# Patient Record
Sex: Female | Born: 1975 | Race: Asian | Hispanic: No | Marital: Married | Smoking: Never smoker
Health system: Southern US, Community
[De-identification: ages and names within clinical notes are randomized; demographics above are authoritative.]

## PROBLEM LIST (undated history)

## (undated) DIAGNOSIS — Z789 Other specified health status: Secondary | ICD-10-CM

## (undated) HISTORY — DX: Other specified health status: Z78.9

## (undated) HISTORY — PX: BREAST CYST INCISION AND DRAINAGE: SHX14

## (undated) HISTORY — PX: AUGMENTATION MAMMAPLASTY: SUR837

---

## 2008-06-13 ENCOUNTER — Emergency Department (HOSPITAL_COMMUNITY): Admission: EM | Admit: 2008-06-13 | Discharge: 2008-06-13 | Payer: Self-pay | Admitting: Family Medicine

## 2008-11-09 ENCOUNTER — Emergency Department (HOSPITAL_COMMUNITY): Admission: EM | Admit: 2008-11-09 | Discharge: 2008-11-09 | Payer: Self-pay | Admitting: Emergency Medicine

## 2010-07-05 ENCOUNTER — Emergency Department (HOSPITAL_COMMUNITY): Admission: EM | Admit: 2010-07-05 | Discharge: 2010-07-05 | Payer: Self-pay | Admitting: Family Medicine

## 2010-09-23 ENCOUNTER — Inpatient Hospital Stay (HOSPITAL_COMMUNITY)
Admission: AD | Admit: 2010-09-23 | Discharge: 2010-09-23 | Payer: Self-pay | Source: Home / Self Care | Admitting: Obstetrics & Gynecology

## 2011-01-01 LAB — URINALYSIS, ROUTINE W REFLEX MICROSCOPIC
Bilirubin Urine: NEGATIVE
Ketones, ur: NEGATIVE mg/dL
Nitrite: NEGATIVE
Protein, ur: NEGATIVE mg/dL
Specific Gravity, Urine: >1.03 — ABNORMAL HIGH (ref 1.005–1.030)
Urobilinogen, UA: 0.2 mg/dL (ref 0.0–1.0)

## 2011-01-01 LAB — CBC
MCH: 30.7 pg (ref 26.0–34.0)
MCHC: 34.3 g/dL (ref 30.0–36.0)
MCV: 89.5 fL (ref 78.0–100.0)
Platelets: 358 10*3/uL (ref 150–400)
RBC: 3.72 MIL/uL — ABNORMAL LOW (ref 3.87–5.11)

## 2011-01-01 LAB — WET PREP, GENITAL
Clue Cells Wet Prep HPF POC: NONE SEEN
Trich, Wet Prep: NONE SEEN
Yeast Wet Prep HPF POC: NONE SEEN

## 2011-01-01 LAB — GC/CHLAMYDIA PROBE AMP, GENITAL: GC Probe Amp, Genital: NEGATIVE

## 2011-01-01 LAB — POCT PREGNANCY, URINE: Preg Test, Ur: NEGATIVE

## 2011-01-01 LAB — URINE MICROSCOPIC-ADD ON

## 2011-01-03 LAB — POCT URINALYSIS DIPSTICK
Glucose, UA: NEGATIVE mg/dL
Hgb urine dipstick: NEGATIVE
Nitrite: NEGATIVE
Protein, ur: NEGATIVE mg/dL
Specific Gravity, Urine: 1.02 (ref 1.005–1.030)
Urobilinogen, UA: 0.2 mg/dL (ref 0.0–1.0)

## 2011-01-03 LAB — WET PREP, GENITAL
Clue Cells Wet Prep HPF POC: NONE SEEN
Trich, Wet Prep: NONE SEEN

## 2011-01-03 LAB — POCT PREGNANCY, URINE: Preg Test, Ur: NEGATIVE

## 2013-12-23 ENCOUNTER — Other Ambulatory Visit (HOSPITAL_COMMUNITY)
Admission: RE | Admit: 2013-12-23 | Discharge: 2013-12-23 | Disposition: A | Discharge status: Home / Self Care | Payer: 59 | Source: Ambulatory Visit | Attending: Emergency Medicine | Admitting: Emergency Medicine

## 2013-12-23 ENCOUNTER — Emergency Department (INDEPENDENT_AMBULATORY_CARE_PROVIDER_SITE_OTHER)
Admission: EM | Admit: 2013-12-23 | Discharge: 2013-12-23 | Disposition: A | Discharge status: Home / Self Care | Payer: 59 | Source: Home / Self Care | Attending: Emergency Medicine | Admitting: Emergency Medicine

## 2013-12-23 ENCOUNTER — Encounter (HOSPITAL_COMMUNITY): Payer: Self-pay | Admitting: Emergency Medicine

## 2013-12-23 DIAGNOSIS — N760 Acute vaginitis: Secondary | ICD-10-CM | POA: Insufficient documentation

## 2013-12-23 DIAGNOSIS — N809 Endometriosis, unspecified: Secondary | ICD-10-CM

## 2013-12-23 DIAGNOSIS — Z113 Encounter for screening for infections with a predominantly sexual mode of transmission: Secondary | ICD-10-CM | POA: Insufficient documentation

## 2013-12-23 LAB — POCT URINALYSIS DIP (DEVICE)
BILIRUBIN URINE: NEGATIVE
Glucose, UA: NEGATIVE mg/dL
HGB URINE DIPSTICK: NEGATIVE
KETONES UR: NEGATIVE mg/dL
Leukocytes, UA: NEGATIVE
NITRITE: NEGATIVE
PH: 7 (ref 5.0–8.0)
Protein, ur: NEGATIVE mg/dL
SPECIFIC GRAVITY, URINE: 1.015 (ref 1.005–1.030)
Urobilinogen, UA: 0.2 mg/dL (ref 0.0–1.0)

## 2013-12-23 LAB — POCT PREGNANCY, URINE: Preg Test, Ur: NEGATIVE

## 2013-12-23 MED ORDER — TRAMADOL HCL 50 MG PO TABS: 100.0000 mg | ORAL_TABLET | Freq: Three times a day (TID) | ORAL | Status: DC | PRN

## 2013-12-23 MED ORDER — MELOXICAM 15 MG PO TABS: 15.0000 mg | ORAL_TABLET | Freq: Every day | ORAL | Status: DC

## 2013-12-23 NOTE — ED Notes (Signed)
Reports low abdominal pain and back pain with periods, before and after.  Reports "stronger " pain in the last 3 months.  No vomiting, no nausea, no diarrhea, ?vaginal discharge

## 2013-12-23 NOTE — Discharge Instructions (Signed)
Endometriosis Endometriosis is a condition in which the tissue that lines the uterus (endometrium) grows outside of its normal location. The tissue may grow in many locations close to the uterus, but it commonly grows on the ovaries, fallopian tubes, vagina, or bowel. Because the uterus expels, or sheds, its lining every menstrual cycle, there is bleeding wherever the endometrial tissue is located. This can cause pain because blood is irritating to tissues not normally exposed to it.  CAUSES  The cause of endometriosis is not known.  SIGNS AND SYMPTOMS  Often, there are no symptoms. When symptoms are present, they can vary with the location of the displaced tissue. Various symptoms can occur at different times. Although symptoms occur mainly during a woman's menstrual period, they can also occur midcycle and usually stop with menopause. Some people may go months with no symptoms at all. Symptoms may include:   Back or abdominal pain.   Heavier bleeding during periods.   Pain during intercourse.   Painful bowel movements.   Infertility. DIAGNOSIS  Your health care provider will do a physical exam and ask about your symptoms. Various tests may be done, such as:   Blood tests and urine tests. These are done to help rule out other problems.   Ultrasound. This test is done to look for abnormal tissue.   An X-ray of the lower bowel (barium enema).  Laparoscopy. In this procedure, a thin, lighted tube with a tiny camera on the end (laparoscope) is inserted into your abdomen. This helps your health care provider look for abnormal tissue to confirm the diagnosis. The health care provider may also remove a small piece of tissue (biopsy) from any abnormal tissue found. This tissue sample can then be sent to a lab so it can be looked at under a microscope. TREATMENT  Treatment will vary and may include:   Medicines to relieve pain. Nonsteroidal anti-inflammatory drugs (NSAIDs) are a type of  pain medicine that can help to relieve the pain caused by endometriosis.  Hormonal therapy. When using hormonal therapy, periods are eliminated. This eliminates the monthly exposure to blood by the displaced endometrial tissue.   Surgery. Surgery may sometimes be done to remove the abnormal endometrial tissue. In severe cases, surgery may be done to remove the fallopian tubes, uterus, and ovaries (hysterectomy). HOME CARE INSTRUCTIONS   Only take over-the-counter or prescription medicines for pain, discomfort, or fever as directed by your health care provider. Do not take aspirin because it may increase bleeding when you are not on hormonal therapy.   Avoid activities that produce pain, including sexual activity. SEEK MEDICAL CARE IF:  You have pelvic pain before, after, or during your periods.  You have pelvic pain between periods that gets worse during your period.  You have pelvic pain during or after sex.  You have pelvic pain with bowel movements or urination, especially during your period.  You have problems getting pregnant. SEEK IMMEDIATE MEDICAL CARE IF:   Your pain is severe and is not responding to pain medicine.   You have severe nausea and vomiting, or you cannot keep foods down.   You have pain that is limited to the right lower part of your abdomen.   You have swelling or increasing pain in your abdomen.   You see blood in your stool.   You have a fever or persistent symptoms for more than 2 3 days.   You have a fever and your symptoms suddenly get worse. MAKE SURE YOU:     Understand these instructions.  Will watch your condition.  Will get help right away if you are not doing well or get worse. Document Released: 10/04/2000 Document Revised: 07/28/2013 Document Reviewed: 06/04/2013 Exit[employer] Patient Information 2014 [employer].  

## 2013-12-23 NOTE — ED Provider Notes (Signed)
Chief Complaint   Chief Complaint  Patient presents with  . Abdominal Pain    History of Present Illness   Heather Contreras is a 38 year old female who has had a several month long history of recurring right lower quadrant abdominal pain with radiation to the back. This occurs only during her menses. She denies any pain when she's not on her menses. She has had no fever, chills, nausea, vomiting, anorexia, weight loss. She denies any urinary symptoms. No constipation, diarrhea, or blood in the stool. She has had some whitish vaginal discharge. She denies any itching or odor. She has not had a pelvic exam or Pap smear for several years. Does not have a primary care physician in [place].  Review of Systems   Other than as noted above, the patient denies any of the following symptoms: Systemic:  No fever or chills GI:  No abdominal pain, nausea, vomiting, diarrhea, constipation, melena or hematochezia. GU:  No dysuria, frequency, urgency, hematuria, vaginal discharge, itching, or abnormal vaginal bleeding.  PMFSH   Past medical history, family history, social history, meds, and allergies were reviewed.    Physical Examination    Vital signs:  BP 97/66  Pulse 75  Temp(Src) 98.6 F (37 C) (Oral)  Resp 12  SpO2 100%  LMP 12/06/2013 General:  Alert, oriented and in no distress. Lungs:  Breath sounds clear and equal bilaterally.  No wheezes, rales or rhonchi. Heart:  Regular rhythm.  No gallops or murmers. Abdomen:  Soft, flat and non-distended.  No organomegaly or mass.  No tenderness, guarding or rebound.  Bowel sounds normally active. Pelvic exam:  Normal external genitalia. Vaginal and cervical mucosa were normal. There was a small amount of white discharge. No pain on cervical motion. Uterus was posterior but normal in size and shape and nontender. There is minimal right adnexal tenderness, no mass, no tenderness or mass on the left.  DNA probes for gonorrhea, Chlamydia,  Trichomonas, Gardnerella, Candida were obtained. Skin:  Clear, warm and dry.  Labs   Results for orders placed during the hospital encounter of 12/23/13  POCT URINALYSIS DIP (DEVICE)      Result Value Ref Range   Glucose, UA NEGATIVE  NEGATIVE mg/dL   Bilirubin Urine NEGATIVE  NEGATIVE   Ketones, ur NEGATIVE  NEGATIVE mg/dL   Specific Gravity, Urine 1.015  1.005 - 1.030   Hgb urine dipstick NEGATIVE  NEGATIVE   pH 7.0  5.0 - 8.0   Protein, ur NEGATIVE  NEGATIVE mg/dL   Urobilinogen, UA 0.2  0.0 - 1.0 mg/dL   Nitrite NEGATIVE  NEGATIVE   Leukocytes, UA NEGATIVE  NEGATIVE  POCT PREGNANCY, URINE      Result Value Ref Range   Preg Test, Ur NEGATIVE  NEGATIVE    Assessment   The encounter diagnosis was Endometriosis.       Plan    1.  Meds:  The following meds were prescribed:   New Prescriptions   MELOXICAM (MOBIC) 15 MG TABLET    Take 1 tablet (15 mg total) by mouth daily.   TRAMADOL (ULTRAM) 50 MG TABLET    Take 2 tablets (100 mg total) by mouth every 8 (eight) hours as needed.    2.  Patient Education/Counseling:  The patient was given appropriate handouts, self care instructions, and instructed in symptomatic relief.    3.  Follow up:  The patient was told to follow up here if no better in 3 to 4 days, or sooner f  becoming worse in any way, and given some red flag symptoms such as worsening pain, fever, persistent vomiting, or heavy vaginal bleeding which would prompt immediate return.  Followup at [facility].     Reuben Likesavid C Fumio Vandam, MD 12/23/13 [phone]

## 2013-12-24 LAB — CERVICOVAGINAL ANCILLARY ONLY
Chlamydia: NEGATIVE
Neisseria Gonorrhea: NEGATIVE
WET PREP (BD AFFIRM): NEGATIVE
WET PREP (BD AFFIRM): NEGATIVE
Wet Prep (BD Affirm): NEGATIVE

## 2013-12-24 NOTE — Progress Notes (Signed)
Quick Note:  Test result was normal. No further action is needed at this time. ______ 

## 2014-01-20 ENCOUNTER — Encounter: Payer: 59 | Admitting: Family Medicine

## 2014-01-20 ENCOUNTER — Telehealth: Payer: Self-pay

## 2014-01-20 NOTE — Telephone Encounter (Signed)
Called pt. As she missed today's appointment with Dr. Shawnie PonsPratt; [facility] Pt. Stated "I'm so sorry I forgot." Informed pt. We can re-schedule. Pt. Asked if she could call back after looking at her schedule. Gave pt. Clinic number and told her she can call to re-schedule her appointment; informed her we are booked until May. Pt. Verbalized understanding and gratitude. No questions or concerns.

## 2016-02-14 ENCOUNTER — Other Ambulatory Visit: Payer: Self-pay

## 2016-02-14 DIAGNOSIS — Z1231 Encounter for screening mammogram for malignant neoplasm of breast: Secondary | ICD-10-CM

## 2016-03-14 ENCOUNTER — Other Ambulatory Visit: Payer: Self-pay

## 2016-03-14 DIAGNOSIS — N63 Unspecified lump in unspecified breast: Secondary | ICD-10-CM

## 2016-03-22 ENCOUNTER — Ambulatory Visit
Admission: RE | Admit: 2016-03-22 | Discharge: 2016-03-22 | Disposition: A | Discharge status: Home / Self Care | Payer: BLUE CROSS/BLUE SHIELD | Source: Ambulatory Visit

## 2016-03-22 DIAGNOSIS — N63 Unspecified lump in unspecified breast: Secondary | ICD-10-CM

## 2016-08-22 ENCOUNTER — Other Ambulatory Visit: Payer: Self-pay | Admitting: Emergency Medicine

## 2016-08-22 DIAGNOSIS — N6310 Unspecified lump in the right breast, unspecified quadrant: Secondary | ICD-10-CM

## 2016-09-23 ENCOUNTER — Other Ambulatory Visit: Payer: BLUE CROSS/BLUE SHIELD

## 2016-09-26 ENCOUNTER — Other Ambulatory Visit: Payer: BLUE CROSS/BLUE SHIELD

## 2016-10-10 ENCOUNTER — Ambulatory Visit
Admission: RE | Admit: 2016-10-10 | Discharge: 2016-10-10 | Disposition: A | Discharge status: Home / Self Care | Payer: BLUE CROSS/BLUE SHIELD | Source: Ambulatory Visit | Attending: Emergency Medicine | Admitting: Emergency Medicine

## 2016-10-10 DIAGNOSIS — N6310 Unspecified lump in the right breast, unspecified quadrant: Secondary | ICD-10-CM

## 2017-02-03 ENCOUNTER — Ambulatory Visit (HOSPITAL_COMMUNITY)
Admission: EM | Admit: 2017-02-03 | Discharge: 2017-02-03 | Disposition: A | Discharge status: Home / Self Care | Payer: BLUE CROSS/BLUE SHIELD | Attending: Internal Medicine | Admitting: Internal Medicine

## 2017-02-03 ENCOUNTER — Encounter (HOSPITAL_COMMUNITY): Payer: Self-pay | Admitting: Emergency Medicine

## 2017-02-03 DIAGNOSIS — R509 Fever, unspecified: Secondary | ICD-10-CM

## 2017-02-03 DIAGNOSIS — B349 Viral infection, unspecified: Secondary | ICD-10-CM

## 2017-02-03 NOTE — Discharge Instructions (Signed)
Tylenol every 4 hours for fever.  Drink plenty of fluids.

## 2017-02-03 NOTE — ED Provider Notes (Signed)
CSN: [id number]     Arrival date & time 02/03/17  1636 History   None    Chief Complaint  Patient presents with  . Fever   (Consider location/radiation/quality/duration/timing/severity/associated sxs/prior Treatment) The history is provided by the patient. No language interpreter was used.  Fever  Temp source:  Subjective Severity:  Moderate Onset quality:  Gradual Duration:  5 days Timing:  Constant Progression:  Unchanged Chronicity:  New Worsened by:  Nothing Associated symptoms: no nausea   Risk factors: sick contacts   Pt complains of   History reviewed. No pertinent past medical history. History reviewed. No pertinent surgical history. No family history on file. Social History  Substance Use Topics  . Smoking status: Never Smoker  . Smokeless tobacco: Never Used  . Alcohol use Yes     Comment: rarely   OB History    No data available     Review of Systems  Constitutional: Positive for fever.  Gastrointestinal: Negative for nausea.  All other systems reviewed and are negative.   Allergies  Tylenol [acetaminophen]  Home Medications   Prior to Admission medications   Medication Sig Start Date End Date Taking? Authorizing Provider  meloxicam (MOBIC) 15 MG tablet Take 1 tablet (15 mg total) by mouth daily. 12/23/13   Reuben Likes, MD  traMADol (ULTRAM) 50 MG tablet Take 2 tablets (100 mg total) by mouth every 8 (eight) hours as needed. 12/23/13   Reuben Likes, MD   Meds Ordered and Administered this Visit  Medications - No data to display  BP 93/66 (BP Location: Left Arm)   Pulse 73   Temp 98.5 F (36.9 C) (Oral)   Resp 14   Ht 5' 4.17" (1.63 m)   Wt 111 lb (50.3 kg)   SpO2 99%   BMI 18.95 kg/m  No data found.   Physical Exam  Constitutional: She appears well-developed and well-nourished. No distress.  HENT:  Head: Normocephalic and atraumatic.  Right Ear: External ear normal.  Left Ear: External ear normal.  Nose: Nose normal.   Mouth/Throat: Oropharynx is clear and moist.  Eyes: Conjunctivae are normal.  Neck: Neck supple.  Cardiovascular: Normal rate and regular rhythm.   No murmur heard. Pulmonary/Chest: Effort normal and breath sounds normal. No respiratory distress.  Abdominal: Soft. There is no tenderness.  Musculoskeletal: She exhibits no edema.  Neurological: She is alert.  Skin: Skin is warm and dry.  Psychiatric: She has a normal mood and affect.  Nursing note and vitals reviewed.   Urgent Care Course     Procedures (including critical care time)  Labs Review Labs Reviewed - No data to display  Imaging Review No results found.   Visual Acuity Review  Right Eye Distance:   Left Eye Distance:   Bilateral Distance:    Right Eye Near:   Left Eye Near:    Bilateral Near:         MDM   1. Febrile illness   2. Viral illness    Pt advised tylenol for fever,  Encourage fluids,  Return if any problems. An After Visit Summary was printed and given to the patient.     Lonia Skinner [place], PA-C 02/03/17 1732

## 2017-02-03 NOTE — ED Triage Notes (Signed)
PT reports fever and cold chills for 5 days   Took 400 mg Ibuprofen at 2pm

## 2017-03-10 ENCOUNTER — Encounter: Payer: BLUE CROSS/BLUE SHIELD | Admitting: Medical

## 2017-03-28 ENCOUNTER — Ambulatory Visit (INDEPENDENT_AMBULATORY_CARE_PROVIDER_SITE_OTHER): Payer: BLUE CROSS/BLUE SHIELD | Admitting: Obstetrics & Gynecology

## 2017-03-28 ENCOUNTER — Telehealth: Payer: Self-pay | Admitting: General Practice

## 2017-03-28 ENCOUNTER — Encounter: Payer: Self-pay | Admitting: Obstetrics & Gynecology

## 2017-03-28 ENCOUNTER — Other Ambulatory Visit (HOSPITAL_COMMUNITY): Payer: Self-pay | Admitting: Obstetrics & Gynecology

## 2017-03-28 VITALS — BP 101/75 | HR 67 | Ht 64.0 in | Wt 112.0 lb

## 2017-03-28 DIAGNOSIS — Z124 Encounter for screening for malignant neoplasm of cervix: Secondary | ICD-10-CM

## 2017-03-28 DIAGNOSIS — R102 Pelvic and perineal pain: Secondary | ICD-10-CM | POA: Diagnosis not present

## 2017-03-28 DIAGNOSIS — N6310 Unspecified lump in the right breast, unspecified quadrant: Secondary | ICD-10-CM

## 2017-03-28 DIAGNOSIS — G8929 Other chronic pain: Secondary | ICD-10-CM

## 2017-03-28 DIAGNOSIS — Z113 Encounter for screening for infections with a predominantly sexual mode of transmission: Secondary | ICD-10-CM

## 2017-03-28 NOTE — Telephone Encounter (Signed)
Scheduled mammogram & ultrasound for 6/19 @ 1pm. Called & informed patient. Patient verbalized understanding & had no questions

## 2017-03-28 NOTE — Addendum Note (Signed)
Addended by: Marylynn PearsonHILLMAN, CARRIE on: 03/28/2017 09:11 AM   Modules accepted: Orders

## 2017-03-28 NOTE — Progress Notes (Signed)
   Subjective:    Patient ID: Ronelle Nighhanaporn [place], female    DOB: 1976-03-12, 41 y.o.   MRN: [id number]  HPI 40yo Judie PetitM [place] G0 here today to discuss pain in her RLQ with radiation to her entire right side, head to toe. This occurs about 3 weeks out of each month. She generally does not have the pain 1 week prior to periods. This has been present since she was 41 yo when she started her period. Her periods last 5-6 days. She takes Ponstel with some relief, has been taking this since she was 41 yo. She feels like the pain is worse every year.   Review of Systems She reports normal pap in [place] in 2016. She was married about 3 months ago. Pain with sex with current partner but not with partner in past.    Objective:   Physical Exam Thin AFNAD Breathing, conversing, and ambulating normally NSSR, minimal mobility No adnexal masses or tenderness Nulliparous cervix      Assessment & Plan:  Pelvic pain- get gyn u/s Preventative care- pap with cultures. RTC 3 weeks

## 2017-03-28 NOTE — Addendum Note (Signed)
Addended by: Marylynn PearsonHILLMAN, Andra Heslin on: 03/28/2017 09:07 AM   Modules accepted: Orders

## 2017-04-02 ENCOUNTER — Ambulatory Visit (HOSPITAL_COMMUNITY)
Admission: RE | Admit: 2017-04-02 | Discharge: 2017-04-02 | Disposition: A | Discharge status: Home / Self Care | Payer: BLUE CROSS/BLUE SHIELD | Source: Ambulatory Visit | Attending: Obstetrics & Gynecology | Admitting: Obstetrics & Gynecology

## 2017-04-02 DIAGNOSIS — R938 Abnormal findings on diagnostic imaging of other specified body structures: Secondary | ICD-10-CM | POA: Insufficient documentation

## 2017-04-02 DIAGNOSIS — G8929 Other chronic pain: Secondary | ICD-10-CM | POA: Diagnosis not present

## 2017-04-02 DIAGNOSIS — R102 Pelvic and perineal pain: Secondary | ICD-10-CM | POA: Insufficient documentation

## 2017-04-02 LAB — CYTOLOGY - PAP
Chlamydia: NEGATIVE
Diagnosis: NEGATIVE
HPV (WINDOPATH): NOT DETECTED
NEISSERIA GONORRHEA: NEGATIVE

## 2017-04-08 ENCOUNTER — Ambulatory Visit
Admission: RE | Admit: 2017-04-08 | Discharge: 2017-04-08 | Disposition: A | Discharge status: Home / Self Care | Payer: BLUE CROSS/BLUE SHIELD | Source: Ambulatory Visit | Attending: Obstetrics & Gynecology | Admitting: Obstetrics & Gynecology

## 2017-04-08 DIAGNOSIS — N6310 Unspecified lump in the right breast, unspecified quadrant: Secondary | ICD-10-CM

## 2017-04-14 ENCOUNTER — Other Ambulatory Visit (HOSPITAL_COMMUNITY): Payer: Self-pay | Admitting: Obstetrics & Gynecology

## 2017-04-14 ENCOUNTER — Telehealth: Payer: Self-pay | Admitting: Obstetrics & Gynecology

## 2017-04-14 NOTE — Telephone Encounter (Signed)
Call ing to US Results//vj

## 2017-04-15 NOTE — Telephone Encounter (Signed)
Patient aware of results and will follow up next week to discuss with Dr.Dove

## 2017-04-15 NOTE — Telephone Encounter (Signed)
Called patient no answer or voice mail to leave a message. Patient breast u/s results were discuss with patient.

## 2017-04-21 ENCOUNTER — Ambulatory Visit (INDEPENDENT_AMBULATORY_CARE_PROVIDER_SITE_OTHER): Payer: BLUE CROSS/BLUE SHIELD | Admitting: Obstetrics & Gynecology

## 2017-04-21 VITALS — BP 96/63 | HR 57 | Wt 114.0 lb

## 2017-04-21 DIAGNOSIS — G8929 Other chronic pain: Secondary | ICD-10-CM

## 2017-04-21 DIAGNOSIS — R102 Pelvic and perineal pain: Secondary | ICD-10-CM

## 2017-04-21 NOTE — Progress Notes (Signed)
   Subjective:    Patient ID: Heather Contreras, female    DOB: 04-Jan-1976, 41 y.o.   MRN: [id number]  HPI 41 yo M A G0 here for follow up of her u/s done for chronic pain, esp on the right, lasts 3 weeks of each month.   Review of Systems She is wanting to conceive. She works at [employer].    Objective:   Physical Exam WHWNAFNAD Breathing, conversing, and ambulating normally        Assessment & Plan:  Desires pregnancy- Rec MVI daily to prevent ONTDs CPP- offered diagnostic laparoscopy versus OCPs. She wants to conceive so she doesn't want OCPs I will send [place] a message to schedule the laparoscopy.

## 2017-04-21 NOTE — Patient Instructions (Signed)
Diagnostic Laparoscopy A diagnostic laparoscopy is a procedure to diagnose diseases in the abdomen. During the procedure, a thin, lighted, pencil-sized instrument called a laparoscope is inserted into the abdomen through an incision. The laparoscope allows your health care provider to look at the organs inside your body. Tell a health care provider about:  Any allergies you have.  All medicines you are taking, including vitamins, herbs, eye drops, creams, and over-the-counter medicines.  Any problems you or family members have had with anesthetic medicines.  Any blood disorders you have.  Any surgeries you have had.  Any medical conditions you have. What are the risks? Generally, this is a safe procedure. However, problems can occur, which may include:  Infection.  Bleeding.  Damage to other organs.  Allergic reaction to the anesthetics used during the procedure.  What happens before the procedure?  Do not eat or drink anything after midnight on the night before the procedure or as directed by your health care provider.  Ask your health care provider about: ? Changing or stopping your regular medicines. ? Taking medicines such as aspirin and ibuprofen. These medicines can thin your blood. Do not take these medicines before your procedure if your health care provider instructs you not to.  Plan to have someone take you home after the procedure. What happens during the procedure?  You may be given a medicine to help you relax (sedative).  You will be given a medicine to make you sleep (general anesthetic).  Your abdomen will be inflated with a gas. This will make your organs easier to see.  Small incisions will be made in your abdomen.  A laparoscope and other small instruments will be inserted into the abdomen through the incisions.  A tissue sample may be removed from an organ in the abdomen for examination.  The instruments will be removed from the abdomen.  The  gas will be released.  The incisions will be closed with stitches (sutures). What happens after the procedure? Your blood pressure, heart rate, breathing rate, and blood oxygen level will be monitored often until the medicines you were given have worn off. This information is not intended to replace advice given to you by your health care provider. Make sure you discuss any questions you have with your health care provider. Document Released: 01/13/2001 Document Revised: 02/15/2016 Document Reviewed: 05/20/2014 Elsevier Interactive Patient Education  2018 Elsevier Inc. Diagnostic Laparoscopy, Care After Refer to this sheet in the next few weeks. These instructions provide you with information about caring for yourself after your procedure. Your health care provider may also give you more specific instructions. Your treatment has been planned according to current medical practices, but problems sometimes occur. Call your health care provider if you have any problems or questions after your procedure. What can I expect after the procedure? After your procedure, it is common to have mild discomfort in the throat and abdomen. Follow these instructions at home:  Take over-the-counter and prescription medicines only as told by your health care provider.  Do not drive for 24 hours if you received a sedative.  Return to your normal activities as told by your health care provider.  Do not take baths, swim, or use a hot tub until your health care provider approves. You may shower.  Follow instructions from your health care provider about how to take care of your incision. Make sure you: ? Wash your hands with soap and water before you change your bandage (dressing). If soap and   water are not available, use hand sanitizer. ? Change your dressing as told by your health care provider. ? Leave stitches (sutures), skin glue, or adhesive strips in place. These skin closures may need to stay in place for 2  weeks or longer. If adhesive strip edges start to loosen and curl up, you may trim the loose edges. Do not remove adhesive strips completely unless your health care provider tells you to do that.  Check your incision area every day for signs of infection. Check for: ? More redness, swelling, or pain. ? More fluid or blood. ? Warmth. ? Pus or a bad smell.  It is your responsibility to get the results of your procedure. Ask your health care provider or the department performing the procedure when your results will be ready. Contact a health care provider if:  There is new pain in your shoulders.  You feel light-headed or faint.  You are unable to pass gas or unable to have a bowel movement.  You feel nauseous or you vomit.  You develop a rash.  You have more redness, swelling, or pain around your incision.  You have more fluid or blood coming from your incision.  Your incision feels warm to the touch.  You have pus or a bad smell coming from your incision.  You have a fever or chills. Get help right away if:  Your pain is getting worse.  You have ongoing vomiting.  The edges of your incision open up.  You have trouble breathing.  You have chest pain. This information is not intended to replace advice given to you by your health care provider. Make sure you discuss any questions you have with your health care provider. Document Released: 09/18/2015 Document Revised: 03/14/2016 Document Reviewed: 06/20/2015 Elsevier Interactive Patient Education  2018 Elsevier Inc.  

## 2017-05-05 ENCOUNTER — Encounter (HOSPITAL_COMMUNITY): Payer: Self-pay

## 2017-06-04 ENCOUNTER — Ambulatory Visit (HOSPITAL_BASED_OUTPATIENT_CLINIC_OR_DEPARTMENT_OTHER)
Admission: RE | Admit: 2017-06-04 | Payer: BLUE CROSS/BLUE SHIELD | Source: Ambulatory Visit | Admitting: Obstetrics & Gynecology

## 2017-06-04 ENCOUNTER — Encounter (HOSPITAL_BASED_OUTPATIENT_CLINIC_OR_DEPARTMENT_OTHER): Admission: RE | Payer: Self-pay | Source: Ambulatory Visit

## 2017-06-04 SURGERY — LAPAROSCOPY, DIAGNOSTIC
Anesthesia: Choice

## 2018-11-03 ENCOUNTER — Other Ambulatory Visit: Payer: Self-pay | Admitting: Obstetrics & Gynecology

## 2018-11-03 DIAGNOSIS — N6310 Unspecified lump in the right breast, unspecified quadrant: Secondary | ICD-10-CM

## 2018-11-04 ENCOUNTER — Other Ambulatory Visit: Payer: Self-pay | Admitting: Obstetrics & Gynecology

## 2018-11-04 ENCOUNTER — Other Ambulatory Visit: Payer: Self-pay

## 2018-11-04 DIAGNOSIS — N6310 Unspecified lump in the right breast, unspecified quadrant: Secondary | ICD-10-CM

## 2018-11-05 ENCOUNTER — Encounter: Payer: Self-pay | Admitting: *Deleted

## 2018-11-06 ENCOUNTER — Encounter (HOSPITAL_COMMUNITY): Payer: Self-pay

## 2018-11-06 ENCOUNTER — Ambulatory Visit (HOSPITAL_COMMUNITY)
Admission: EM | Admit: 2018-11-06 | Discharge: 2018-11-06 | Disposition: A | Discharge status: Home / Self Care | Payer: No Typology Code available for payment source | Attending: Internal Medicine | Admitting: Internal Medicine

## 2018-11-06 ENCOUNTER — Other Ambulatory Visit: Payer: Self-pay

## 2018-11-06 DIAGNOSIS — N946 Dysmenorrhea, unspecified: Secondary | ICD-10-CM | POA: Insufficient documentation

## 2018-11-06 MED ORDER — TRAMADOL HCL 50 MG PO TABS: 50.0000 mg | ORAL_TABLET | Freq: Four times a day (QID) | ORAL | 0 refills | Status: DC | PRN

## 2018-11-06 NOTE — ED Triage Notes (Signed)
Pt presents to [facility] for abdominal pain and cramping due to period x1 week, pt has taken Ibuprofen but has no relief

## 2018-11-06 NOTE — ED Notes (Signed)
Pt discharged by provider.

## 2018-11-11 ENCOUNTER — Other Ambulatory Visit: Payer: BLUE CROSS/BLUE SHIELD

## 2018-11-18 ENCOUNTER — Ambulatory Visit
Admission: RE | Admit: 2018-11-18 | Discharge: 2018-11-18 | Disposition: A | Discharge status: Home / Self Care | Payer: No Typology Code available for payment source | Source: Ambulatory Visit | Attending: Obstetrics & Gynecology | Admitting: Obstetrics & Gynecology

## 2018-11-18 DIAGNOSIS — N6310 Unspecified lump in the right breast, unspecified quadrant: Secondary | ICD-10-CM

## 2018-11-26 ENCOUNTER — Telehealth: Payer: Self-pay | Admitting: Obstetrics & Gynecology

## 2018-11-26 NOTE — Telephone Encounter (Signed)
Left a VM for patient to call so we could get her scheduled to come in and see Dr Marice Potter.

## 2018-12-16 ENCOUNTER — Ambulatory Visit (INDEPENDENT_AMBULATORY_CARE_PROVIDER_SITE_OTHER): Payer: No Typology Code available for payment source | Admitting: Obstetrics & Gynecology

## 2018-12-16 ENCOUNTER — Encounter: Payer: Self-pay | Admitting: Obstetrics & Gynecology

## 2018-12-16 VITALS — BP 98/59 | HR 65 | Wt 121.0 lb

## 2018-12-16 DIAGNOSIS — Z319 Encounter for procreative management, unspecified: Secondary | ICD-10-CM | POA: Diagnosis not present

## 2018-12-16 NOTE — Progress Notes (Signed)
   Subjective:    Patient ID: Heather Contreras, female    DOB: 1975-11-21, 43 y.o.   MRN: [id number]  HPI 43 yo married P0 here for follow up after an ER visit for a painful period. I had seen her last year for the same problem and actually had her scheduled for a diagnostic laparoscopy. She cancelled as she went to [place].  She also has the concern that she and her husband have been trying to conceive for 3 years.   Review of Systems     Objective:   Physical Exam Breathing, conversing, and ambulating normally Abd- benign    Assessment & Plan:  Dysmenorrhea- I again offered her a laproscopy as she does not want OCPs. However, I think that it would be better for a RE (Dr April Manson) to do the laparoscopy as part of the work up for infertility so I have referred her to his office.

## 2019-04-10 IMAGING — US ULTRASOUND RIGHT BREAST LIMITED
1 series · 9 of 9 positions shown · non-contrast
Comparison: Previous exam(s).

CLINICAL DATA: Two year follow-up of right breast masses

EXAM:
DIGITAL DIAGNOSTIC BILATERAL MAMMOGRAM WITH IMPLANTS, CAD AND TOMO
ULTRASOUND RIGHT BREAST
The patient has retropectoral implants. Standard and implant
displaced views were performed.

[Series 1: ultrasound right breast limited · 0.05mm/px · 9 of 9 slices shown]
[im 1/9]
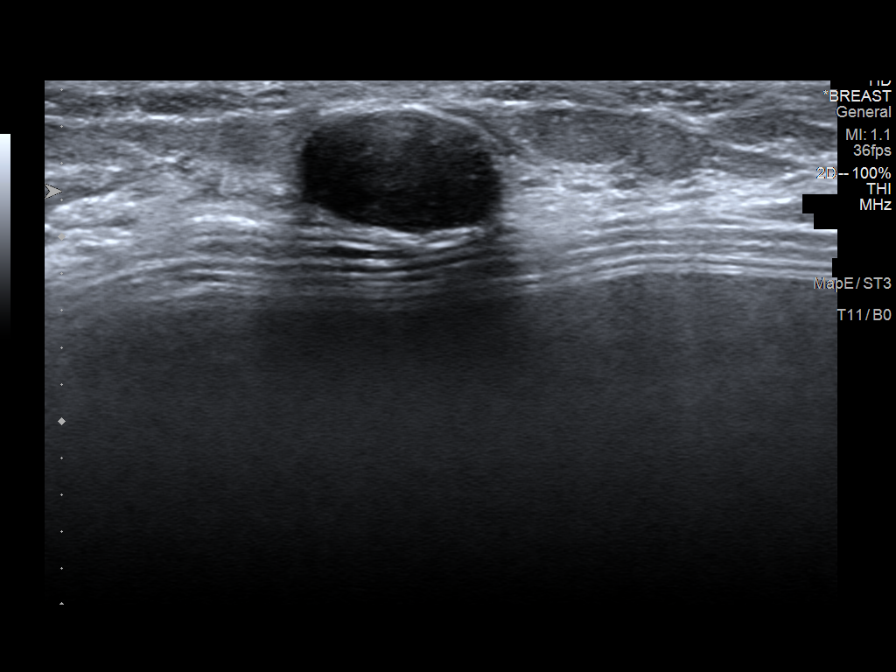
[im 2/9]
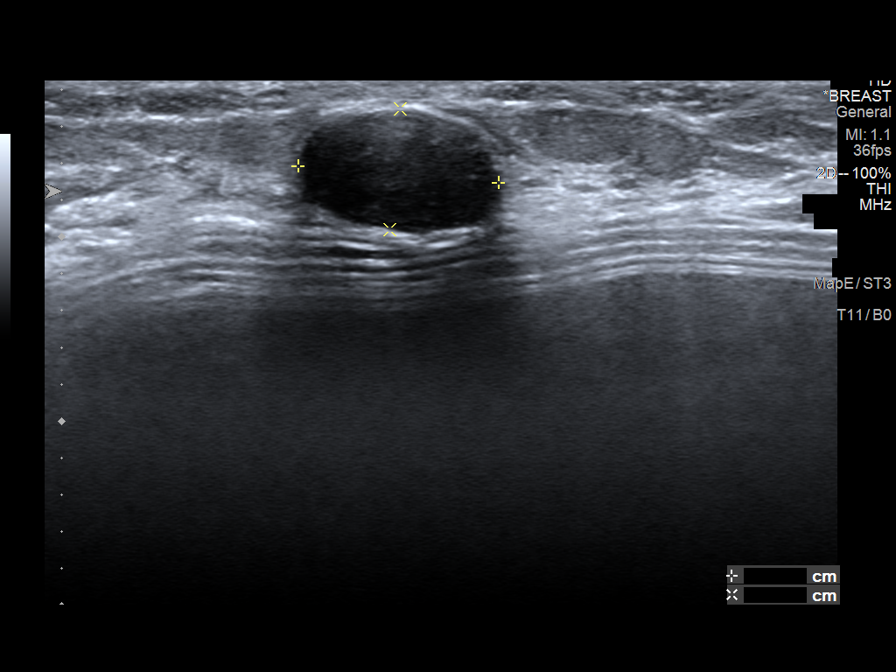
[im 3/9]
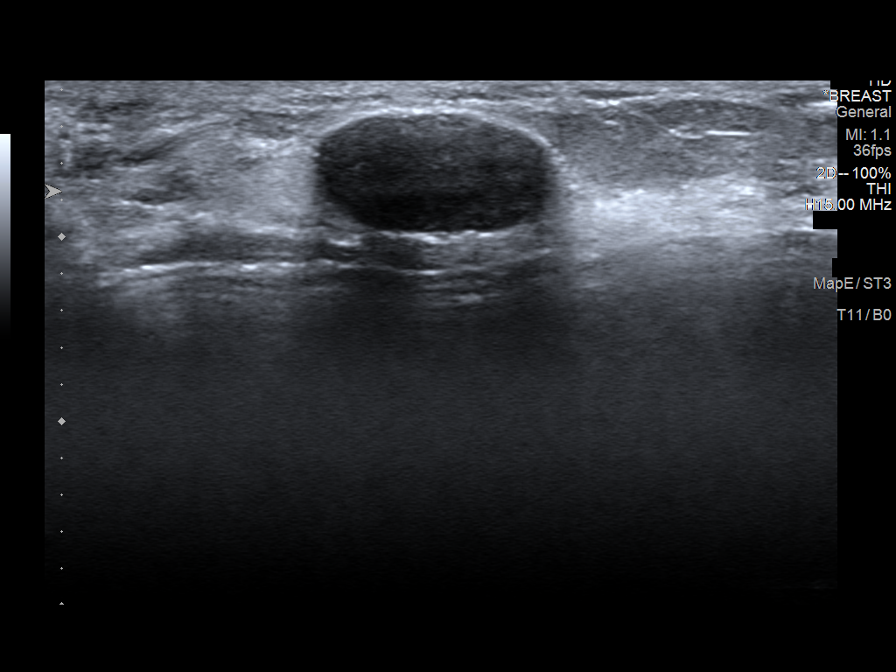
[im 4/9]
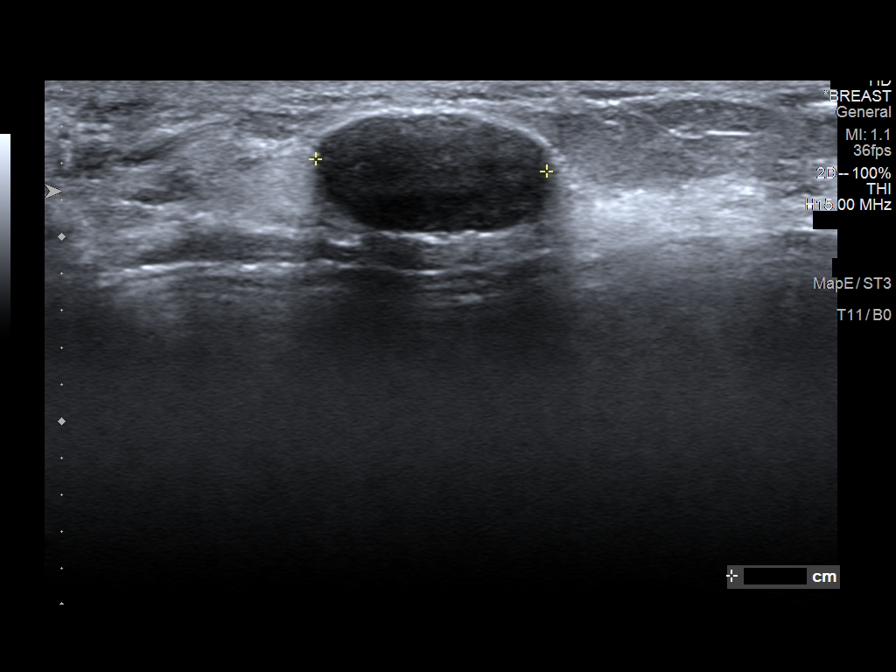
[im 5/9]
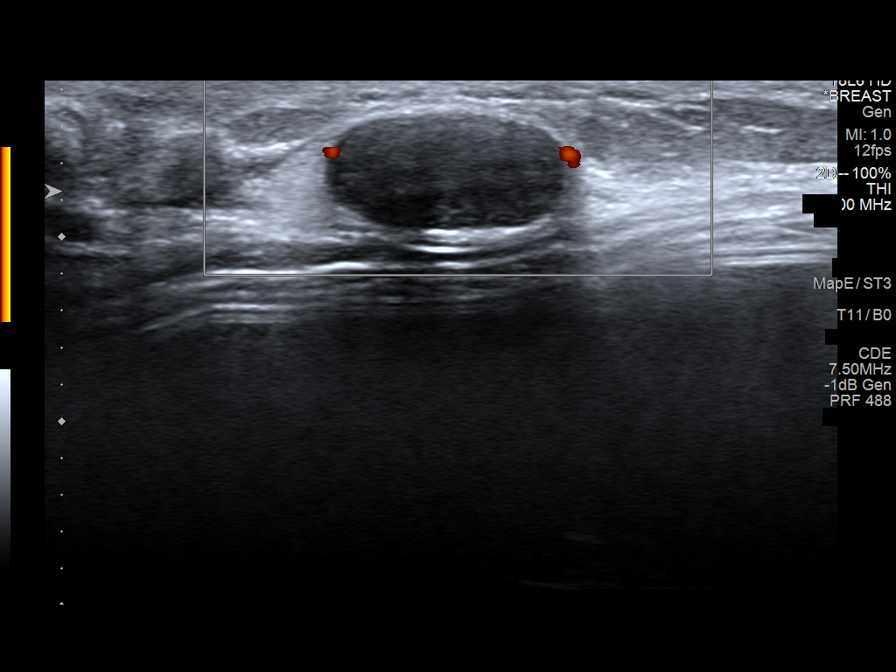
[im 6/9]
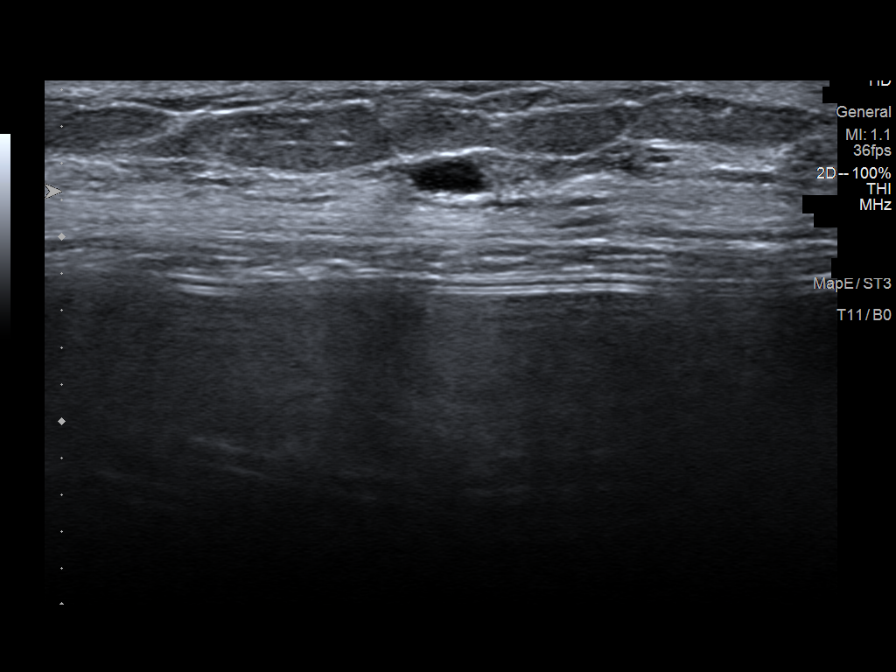
[im 7/9]
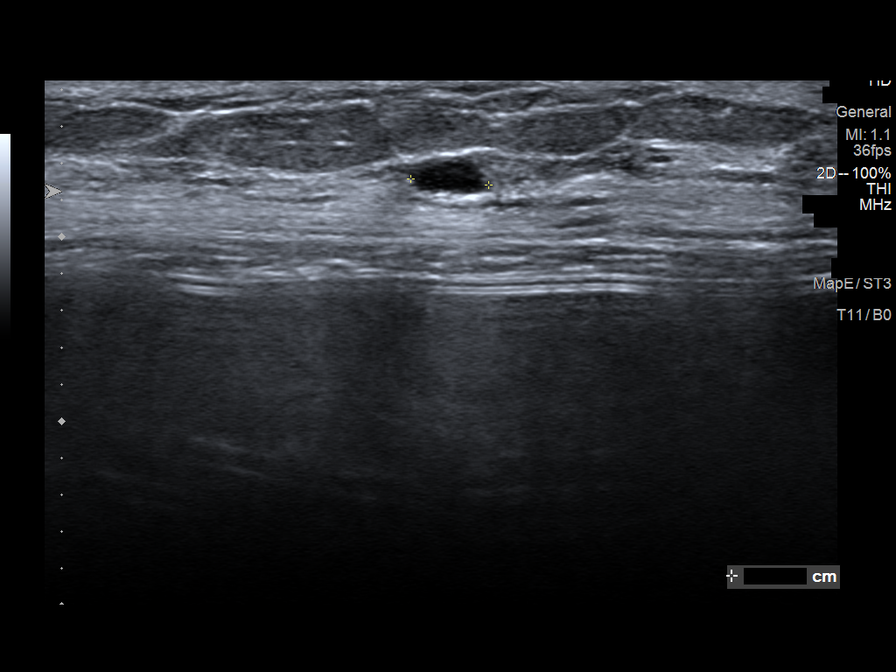
[im 8/9]
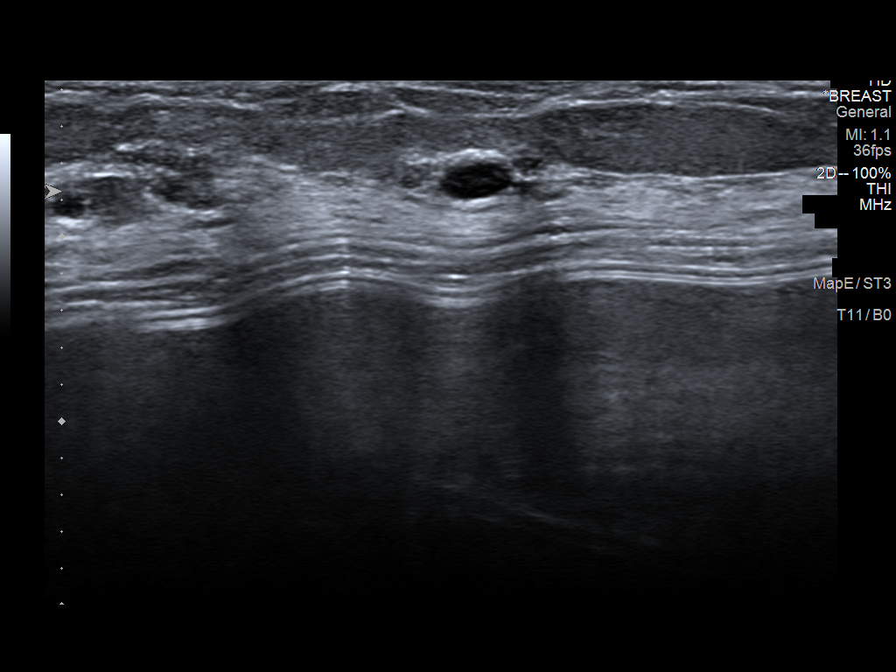
[im 9/9]
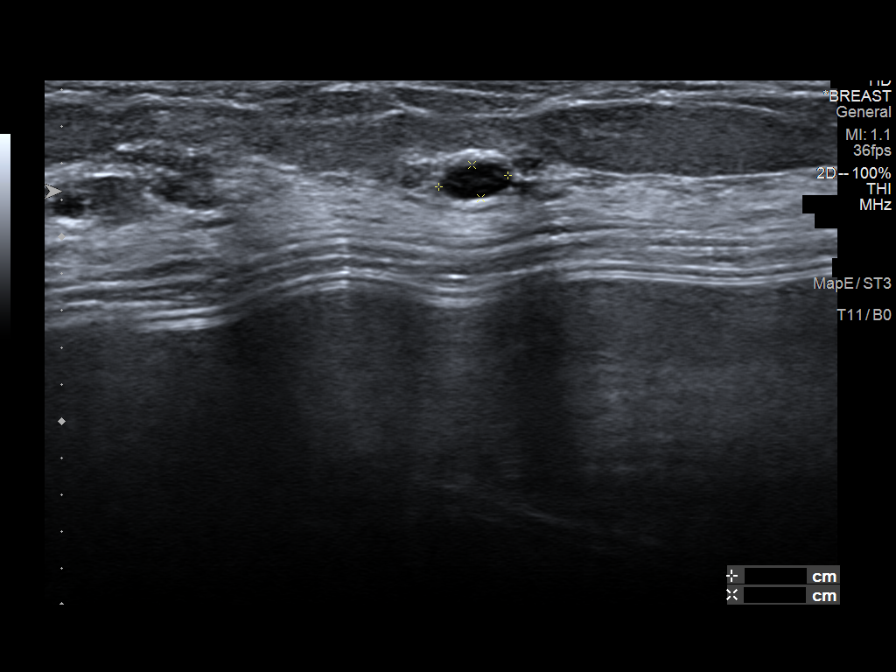

[9 of 9 positions shown; findings below may reference images not displayed]

ACR Breast Density Category c: The breast tissue is heterogeneously
dense, which may obscure small masses.
FINDINGS: The mass in the lateral inferior anterior right breast is unchanged
mammographically. No suspicious findings in either breast.

On physical exam, no suspicious lumps are identified.

Targeted ultrasound is performed, showing a mass at 8 o'clock, 1 cm
from the nipple measuring 11 x 7 x 13 mm today versus 14 x 6 x 13 mm
in Monday March, 2016. An adjacent probable cyst is seen at 9 o'clock, 2
cm from the nipple measuring 4 x 4 x 2 mm today versus 11 x 2 by 7
mm in 7988.

Mammographic images were processed with CAD.
IMPRESSION: Both masses in the right breast are smaller since Monday March, 2016,
consistent with a benign etiology. No evidence of malignancy in
either breast.

RECOMMENDATION:
Annual screening mammography.

I have discussed the findings and recommendations with the patient.
Results were also provided in writing at the conclusion of the
visit. If applicable, a reminder letter will be sent to the patient
regarding the next appointment.

BI-RADS CATEGORY  2: Benign.

## 2019-04-10 IMAGING — MG DIGITAL DIAGNOSTIC BILATERAL MAMMOGRAM WITH IMPLANTS, CAD AND TO
8 of 12 series · 8 of 28 positions shown · non-contrast
Comparison: Previous exam(s).

CLINICAL DATA: Two year follow-up of right breast masses

EXAM:
DIGITAL DIAGNOSTIC BILATERAL MAMMOGRAM WITH IMPLANTS, CAD AND TOMO
ULTRASOUND RIGHT BREAST
The patient has retropectoral implants. Standard and implant
displaced views were performed.

[L MLO]
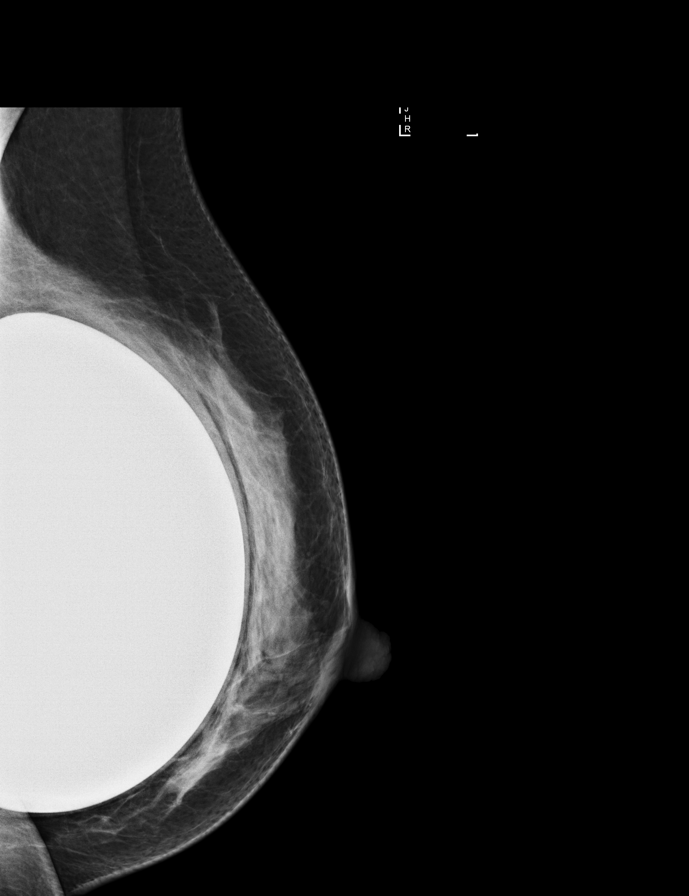

[L CC]
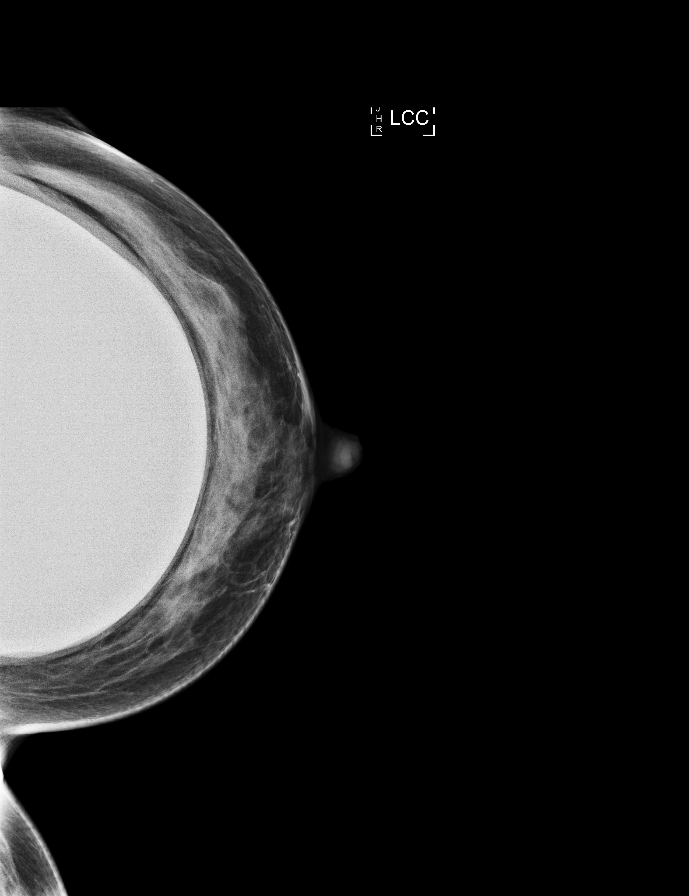

[R CC]
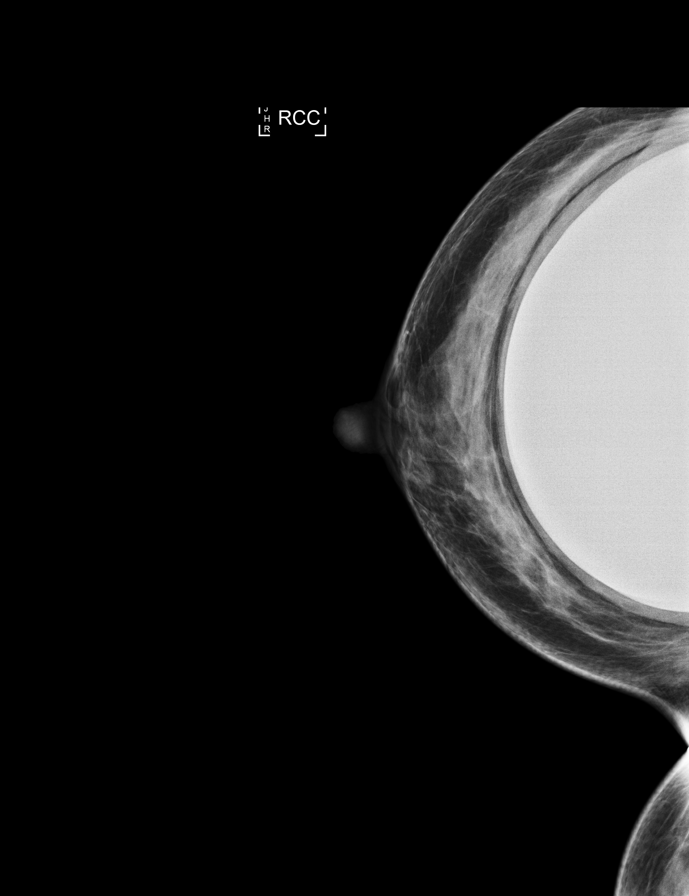

[R MLO]
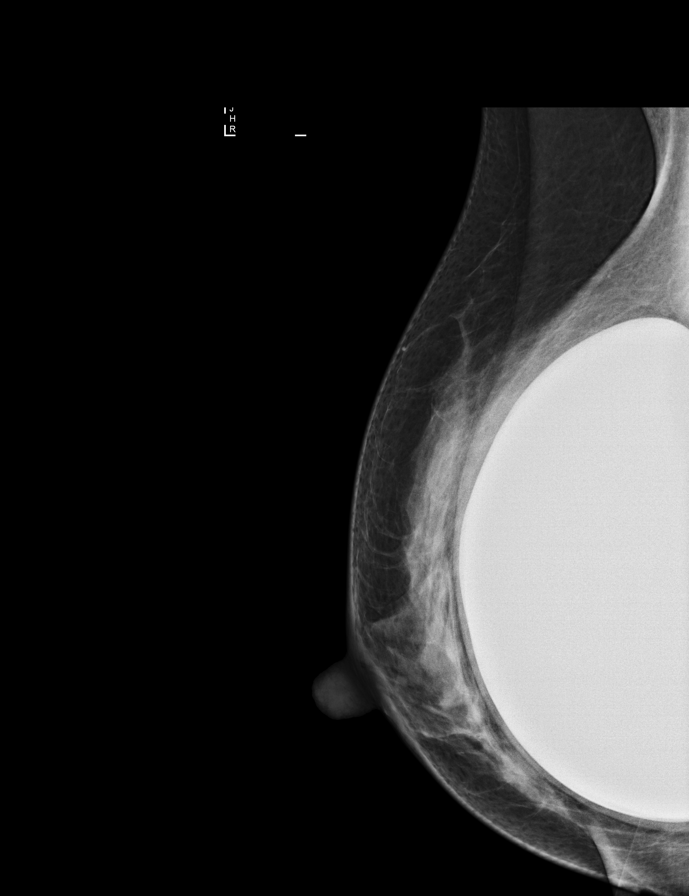

[R MLO synth-2D]
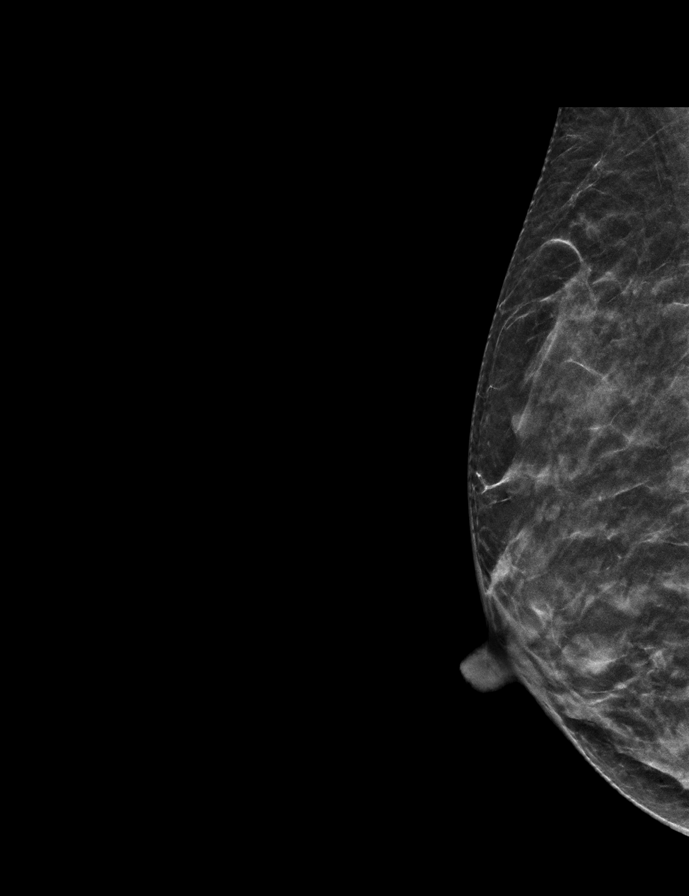

[L MLO synth-2D]
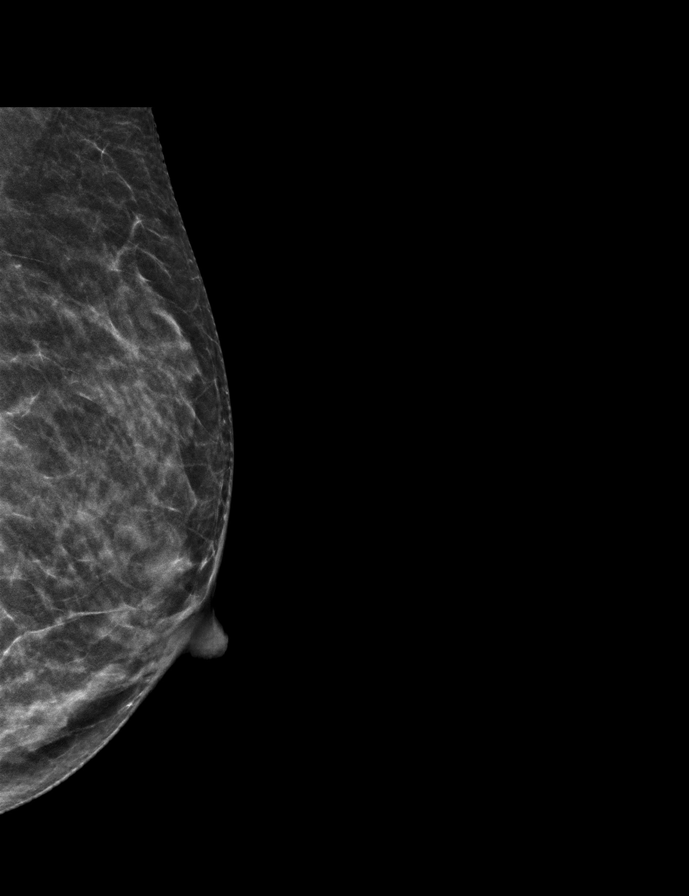

[R CC synth-2D]
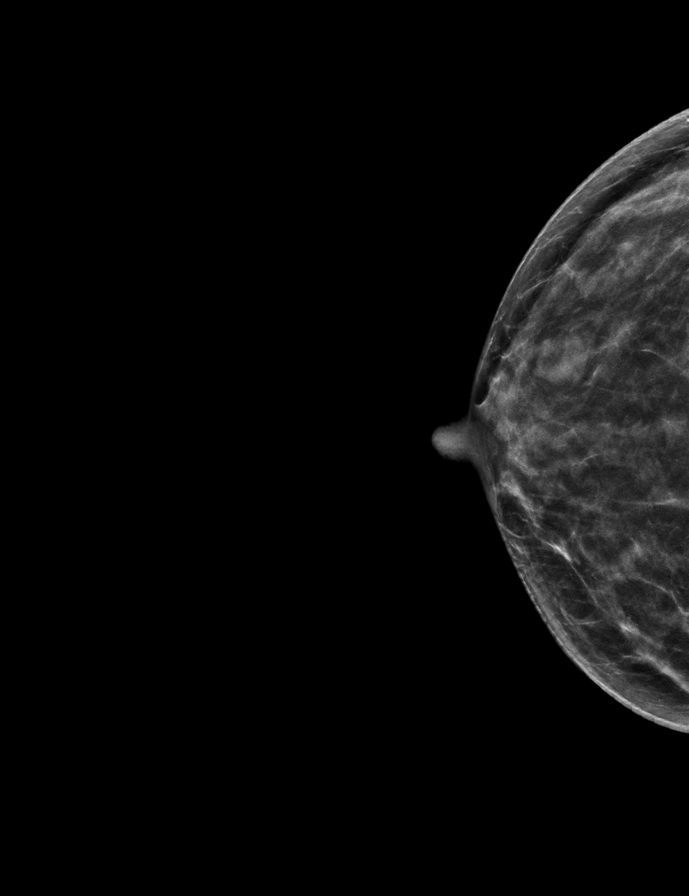

[L CC synth-2D]
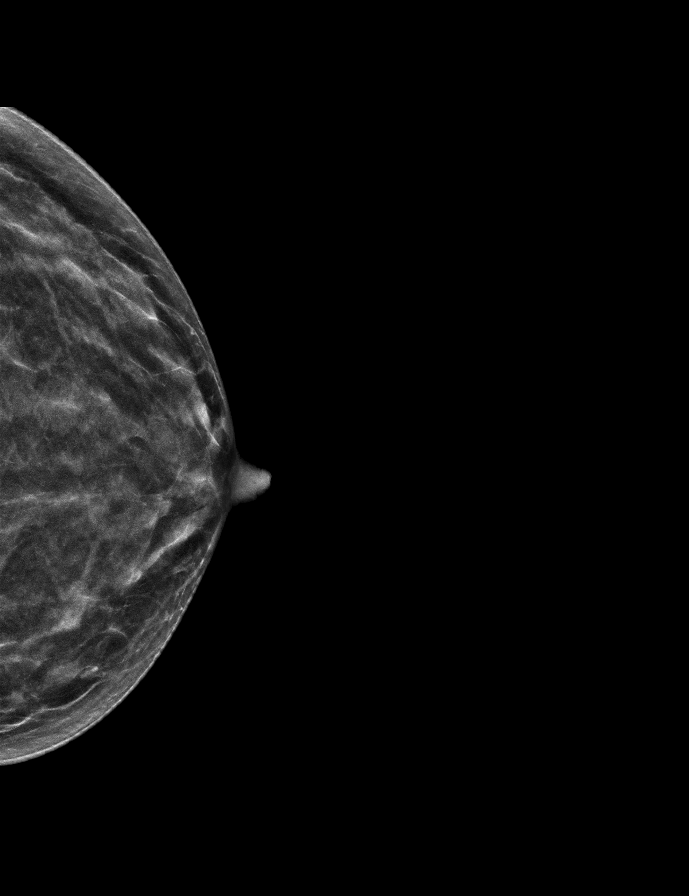

[8 of 28 positions shown; findings below may reference images not displayed]

ACR Breast Density Category c: The breast tissue is heterogeneously
dense, which may obscure small masses.
FINDINGS: The mass in the lateral inferior anterior right breast is unchanged
mammographically. No suspicious findings in either breast.

On physical exam, no suspicious lumps are identified.

Targeted ultrasound is performed, showing a mass at 8 o'clock, 1 cm
from the nipple measuring 11 x 7 x 13 mm today versus 14 x 6 x 13 mm
in Monday March, 2016. An adjacent probable cyst is seen at 9 o'clock, 2
cm from the nipple measuring 4 x 4 x 2 mm today versus 11 x 2 by 7
mm in 7988.

Mammographic images were processed with CAD.
IMPRESSION: Both masses in the right breast are smaller since Monday March, 2016,
consistent with a benign etiology. No evidence of malignancy in
either breast.

RECOMMENDATION:
Annual screening mammography.

I have discussed the findings and recommendations with the patient.
Results were also provided in writing at the conclusion of the
visit. If applicable, a reminder letter will be sent to the patient
regarding the next appointment.

BI-RADS CATEGORY  2: Benign.

## 2019-07-28 NOTE — ED Provider Notes (Signed)
[facility]    CSN: [id number] Arrival date & time: 11/06/18  [place]      History   Chief Complaint Chief Complaint  Patient presents with  . Abdominal Pain    HPI Heather Contreras is a 43 y.o. female.   43 yo female with no chronic medical problems presents to urgent care c/o painful periods. Sometimes her periods last longer than 7 days. Flow is heavy at first but slows toward the end. Menstrual pain not relieved by OTC medications.     History reviewed. No pertinent past medical history.  There are no active problems to display for this patient.   Past Surgical History:  Procedure Laterality Date  . AUGMENTATION MAMMAPLASTY     bilateral    OB History   No obstetric history on file.      Home Medications    Prior to Admission medications   Medication Sig Start Date End Date Taking? Authorizing Provider  Mefenamic Acid (PONSTEL PO) Take by mouth.   Yes [provider]  Multiple Vitamin (MULTIVITAMIN) capsule Take 1 capsule by mouth daily.    [provider]  traMADol (ULTRAM) 50 MG tablet Take 1 tablet (50 mg total) by mouth every 6 (six) hours as needed. Patient not taking: Reported on 12/16/2018 11/06/18   Harrie Foreman, MD    Family History History reviewed. No pertinent family history.  Social History Social History   Tobacco Use  . Smoking status: Never Smoker  . Smokeless tobacco: Never Used  Substance Use Topics  . Alcohol use: Yes    Comment: rarely  . Drug use: Not on file     Allergies   Tylenol [acetaminophen]   Review of Systems Review of Systems  Constitutional: Negative for chills and fever.  HENT: Negative for sore throat and tinnitus.   Eyes: Negative for redness.  Respiratory: Negative for cough and shortness of breath.   Cardiovascular: Negative for chest pain and palpitations.  Gastrointestinal: Negative for abdominal pain, diarrhea, nausea and vomiting.  Genitourinary: Positive for  menstrual problem. Negative for dysuria, frequency and urgency.  Musculoskeletal: Negative for myalgias.  Skin: Negative for rash.       No lesions  Neurological: Negative for weakness.  Hematological: Does not bruise/bleed easily.  Psychiatric/Behavioral: Negative for suicidal ideas.     Physical Exam Triage Vital Signs ED Triage Vitals  Enc Vitals Group     BP 11/06/18 1940 123/71     Pulse Rate 11/06/18 1940 64     Resp 11/06/18 1940 16     Temp 11/06/18 1940 98.8 F (37.1 C)     Temp Source 11/06/18 1940 Oral     SpO2 11/06/18 1940 100 %     Weight --      Height --      Head Circumference --      Peak Flow --      Pain Score 11/06/18 1941 10     Pain Loc --      Pain Edu? --      Excl. in [place]? --    No data found.  Updated Vital Signs BP 123/71 (BP Location: Left Arm)   Pulse 64   Temp 98.8 F (37.1 C) (Oral)   Resp 16   LMP 10/29/2018 (Exact Date)   SpO2 100%   Visual Acuity Right Eye Distance:   Left Eye Distance:   Bilateral Distance:    Right Eye Near:   Left Eye Near:  Bilateral Near:     Physical Exam Vitals signs and nursing note reviewed.  Constitutional:      General: She is not in acute distress.    Appearance: She is well-developed.  HENT:     Head: Normocephalic and atraumatic.  Eyes:     General: No scleral icterus.    Conjunctiva/sclera: Conjunctivae normal.     Pupils: Pupils are equal, round, and reactive to light.  Neck:     Musculoskeletal: Normal range of motion and neck supple.     Thyroid: No thyromegaly.     Vascular: No JVD.     Trachea: No tracheal deviation.  Cardiovascular:     Rate and Rhythm: Normal rate and regular rhythm.     Heart sounds: Normal heart sounds. No murmur. No friction rub. No gallop.   Pulmonary:     Effort: Pulmonary effort is normal.     Breath sounds: Normal breath sounds.  Abdominal:     General: Bowel sounds are normal. There is no distension.     Palpations: Abdomen is soft.      Tenderness: There is generalized abdominal tenderness.  Musculoskeletal: Normal range of motion.  Lymphadenopathy:     Cervical: No cervical adenopathy.  Skin:    General: Skin is warm and dry.  Neurological:     Mental Status: She is alert and oriented to person, place, and time.     Cranial Nerves: No cranial nerve deficit.  Psychiatric:        Behavior: Behavior normal.        Thought Content: Thought content normal.        Judgment: Judgment normal.      UC Treatments / Results  Labs (all labs ordered are listed, but only abnormal results are displayed) Labs Reviewed - No data to display  EKG   Radiology No results found.  Procedures Procedures (including critical care time)  Medications Ordered in UC Medications - No data to display  Initial Impression / Assessment and Plan / UC Course  I have reviewed the triage vital signs and the nursing notes.  Pertinent labs & imaging results that were available during my care of the patient were reviewed by me and considered in my medical decision making (see chart for details).     Ddx includes perimenopausal bleeding. Refer to GYN for further evaluation.   Final Clinical Impressions(s) / UC Diagnoses   Final diagnoses:  Menstrual pain   Discharge Instructions   None    ED Prescriptions    Medication Sig Dispense Auth. Provider   traMADol (ULTRAM) 50 MG tablet Take 1 tablet (50 mg total) by mouth every 6 (six) hours as needed. Patient not taking:  Reported on 12/16/2018 15 tablet Arnaldo Natal, MD     PDMP not reviewed this encounter.   Arnaldo Natal, MD 07/28/19 [id number]

## 2019-08-07 ENCOUNTER — Other Ambulatory Visit: Payer: Self-pay

## 2019-08-07 ENCOUNTER — Encounter (HOSPITAL_COMMUNITY): Payer: Self-pay

## 2019-08-07 ENCOUNTER — Ambulatory Visit (HOSPITAL_COMMUNITY)
Admission: EM | Admit: 2019-08-07 | Discharge: 2019-08-07 | Disposition: A | Discharge status: Home / Self Care | Payer: No Typology Code available for payment source | Attending: Family Medicine | Admitting: Family Medicine

## 2019-08-07 DIAGNOSIS — R1310 Dysphagia, unspecified: Secondary | ICD-10-CM | POA: Diagnosis not present

## 2019-08-07 MED ORDER — CETIRIZINE HCL 10 MG PO CAPS: 10.0000 mg | ORAL_CAPSULE | Freq: Every day | ORAL | 0 refills | Status: DC

## 2019-08-07 NOTE — Discharge Instructions (Signed)
Begin daily cetirizine to help with any drainage contributing to symptoms  Follow up with gastroenterology for further evaluation of symptoms

## 2019-08-07 NOTE — ED Provider Notes (Signed)
[facility]    CSN: [id number] Arrival date & time: 08/07/19  1731      History   Chief Complaint Chief Complaint  Patient presents with  . something in her throat    HPI Heather Contreras is a 43 y.o. female significant past medical history presenting today for evaluation of something in her throat.  Patient states that over the past month she has had a sensation as if something is in her throat.  She denies associated pain.  Denies difficulty swallowing.  Denies sensation of food or drink getting stuck.  States that she notices the symptoms worse at nighttime when she is lying flat; but symptoms are always there during the day as well.  She denies swallowing any foreign bodies.  She denies pain.  Denies history of thyroid issues.  Denies recently being sick including denying URI symptoms of cough congestion or sore throat.  Denies chest pain or shortness of breath.  HPI  History reviewed. No pertinent past medical history.  There are no active problems to display for this patient.   Past Surgical History:  Procedure Laterality Date  . AUGMENTATION MAMMAPLASTY     bilateral    OB History   No obstetric history on file.      Home Medications    Prior to Admission medications   Medication Sig Start Date End Date Taking? Authorizing Provider  Cetirizine HCl 10 MG CAPS Take 1 capsule (10 mg total) by mouth daily for 14 days. 08/07/19 08/21/19  Bronislaus Verdell C, PA-C  Mefenamic Acid (PONSTEL PO) Take by mouth.    [provider]  Multiple Vitamin (MULTIVITAMIN) capsule Take 1 capsule by mouth daily.    [provider]  traMADol (ULTRAM) 50 MG tablet Take 1 tablet (50 mg total) by mouth every 6 (six) hours as needed. Patient not taking: Reported on 12/16/2018 11/06/18   Arnaldo Nataliamond, Michael S, MD    Family History No family history on file.  Social History Social History   Tobacco Use  . Smoking status: Never Smoker  . Smokeless tobacco:  Never Used  Substance Use Topics  . Alcohol use: Yes    Comment: rarely  . Drug use: Not on file     Allergies   Tylenol [acetaminophen]   Review of Systems Review of Systems  Constitutional: Negative for activity change, appetite change, chills, fatigue and fever.  HENT: Negative for congestion, ear pain, rhinorrhea, sinus pressure, sore throat and trouble swallowing.   Eyes: Negative for discharge and redness.  Respiratory: Negative for cough, chest tightness and shortness of breath.   Cardiovascular: Negative for chest pain.  Gastrointestinal: Negative for abdominal pain, diarrhea, nausea and vomiting.  Musculoskeletal: Negative for myalgias.  Skin: Negative for rash.  Neurological: Negative for dizziness, light-headedness and headaches.     Physical Exam Triage Vital Signs ED Triage Vitals  Enc Vitals Group     BP 08/07/19 1803 107/62     Pulse Rate 08/07/19 1803 63     Resp 08/07/19 1801 16     Temp 08/07/19 1803 98.7 F (37.1 C)     Temp Source 08/07/19 1801 Oral     SpO2 08/07/19 1803 100 %     Weight 08/07/19 1801 120 lb (54.4 kg)     Height --      Head Circumference --      Peak Flow --      Pain Score --      Pain Loc --  Pain Edu? --      Excl. in [place]? --    No data found.  Updated Vital Signs BP 107/62 (BP Location: Right Arm)   Pulse 63   Temp 98.7 F (37.1 C)   Resp 15   Wt 120 lb (54.4 kg)   SpO2 100%   BMI 20.60 kg/m   Visual Acuity Right Eye Distance:   Left Eye Distance:   Bilateral Distance:    Right Eye Near:   Left Eye Near:    Bilateral Near:     Physical Exam Vitals signs and nursing note reviewed.  Constitutional:      General: She is not in acute distress.    Appearance: She is well-developed.  HENT:     Head: Normocephalic and atraumatic.     Ears:     Comments: Bilateral ears without tenderness to palpation of external auricle, tragus and mastoid, EAC's without erythema or swelling, TM's with good bony  landmarks and cone of light. Non erythematous.     Mouth/Throat:     Comments: Oral mucosa pink and moist, no tonsillar enlargement or exudate. Posterior pharynx patent and nonerythematous, no uvula deviation or swelling. Normal phonation. Cobblestoning present on posterior pharynx Eyes:     Conjunctiva/sclera: Conjunctivae normal.  Neck:     Musculoskeletal: Neck supple.  Cardiovascular:     Rate and Rhythm: Normal rate and regular rhythm.     Heart sounds: No murmur.  Pulmonary:     Effort: Pulmonary effort is normal. No respiratory distress.     Breath sounds: Normal breath sounds.     Comments: Breathing comfortably at rest, CTABL, no wheezing, rales or other adventitious sounds auscultated Abdominal:     Palpations: Abdomen is soft.     Tenderness: There is no abdominal tenderness.  Skin:    General: Skin is warm and dry.  Neurological:     Mental Status: She is alert.      UC Treatments / Results  Labs (all labs ordered are listed, but only abnormal results are displayed) Labs Reviewed - No data to display  EKG   Radiology No results found.  Procedures Procedures (including critical care time)  Medications Ordered in UC Medications - No data to display  Initial Impression / Assessment and Plan / UC Course  I have reviewed the triage vital signs and the nursing notes.  Pertinent labs & imaging results that were available during my care of the patient were reviewed by me and considered in my medical decision making (see chart for details).     Patient with globus sensation.  Oropharynx clear and without abnormality.  Will initiate on daily cetirizine for the next 2 weeks to help with any drainage contributing to foreign body sensation.  Recommending follow-up with gastroenterology for further evaluation of symptoms as may need further imaging to rule out esophageal disorder.. Discussed strict return precautions. Patient verbalized understanding and is agreeable  with plan.  Final Clinical Impressions(s) / UC Diagnoses   Final diagnoses:  Dysphagia, unspecified type     Discharge Instructions     Begin daily cetirizine to help with any drainage contributing to symptoms  Follow up with gastroenterology for further evaluation of symptoms   ED Prescriptions    Medication Sig Dispense Auth. Provider   Cetirizine HCl 10 MG CAPS Take 1 capsule (10 mg total) by mouth daily for 14 days. 14 capsule Indy Kuck, [place] C, PA-C     PDMP not reviewed this encounter.  Janith Lima, [place] 08/07/19 [phone]

## 2019-08-07 NOTE — ED Triage Notes (Signed)
Pt states she feels like she has something stuck in her throat. X 2 days

## 2019-09-07 ENCOUNTER — Encounter: Payer: Self-pay | Admitting: Gastroenterology

## 2019-10-14 ENCOUNTER — Ambulatory Visit (INDEPENDENT_AMBULATORY_CARE_PROVIDER_SITE_OTHER): Payer: No Typology Code available for payment source | Admitting: Gastroenterology

## 2019-10-14 ENCOUNTER — Encounter: Payer: Self-pay | Admitting: Gastroenterology

## 2019-10-14 VITALS — BP 90/70 | HR 68 | Temp 98.5°F | Ht 63.0 in | Wt 122.1 lb

## 2019-10-14 DIAGNOSIS — R070 Pain in throat: Secondary | ICD-10-CM

## 2019-10-14 DIAGNOSIS — R12 Heartburn: Secondary | ICD-10-CM

## 2019-10-14 MED ORDER — OMEPRAZOLE 40 MG PO CPDR: 40.0000 mg | DELAYED_RELEASE_CAPSULE | Freq: Every day | ORAL | 0 refills | Status: AC

## 2019-10-14 NOTE — Patient Instructions (Signed)
If you are age 41 or older, your body mass index should be between 23-30. Your Body mass index is 21.63 kg/m. If this is out of the aforementioned range listed, please consider follow up with your Primary Care Provider.  If you are age 43 or younger, your body mass index should be between 19-25. Your Body mass index is 21.63 kg/m. If this is out of the aformentioned range listed, please consider follow up with your Primary Care Provider.   We will send your records to Dr Redmond Baseman with ENT.   We have sent the following medications to your pharmacy for you to pick up at your convenience: Omeprazole   It was a pleasure to see you today!  Dr. Loletha Carrow

## 2019-10-14 NOTE — Progress Notes (Signed)
[facility] Consult Note:  History: Heather Contreras 10/14/2019  Referring provider: Patient, No Pcp Per  Reason for consult/chief complaint: globus sensation (feels like something in throat all the time x 2 months)   Subjective  HPI:  This is a very pleasant 43 year old woman referred by primary care for throat discomfort.  She reports about 2 months of a feeling of a knot or fullness in the throat, usually midline but sometimes other areas of pain under the jaw or more toward one side of the neck or the other.  She denies chronic cough, hemoptysis, change in vocal quality, feelings of food or any medicine stuck in the neck or chest.  She has rare episodes of heartburn.  Denies nausea or vomiting, altered bowel habits or rectal bleeding or abdominal pain.  Has not had any previous evaluation for this, has not taken any particular medicines.  ROS:  Review of Systems  Constitutional: Negative for appetite change and unexpected weight change.  HENT: Negative for mouth sores and voice change.   Eyes: Negative for pain and redness.  Respiratory: Negative for cough and shortness of breath.   Cardiovascular: Negative for chest pain and palpitations.  Genitourinary: Negative for dysuria and hematuria.  Musculoskeletal: Negative for arthralgias and myalgias.  Skin: Negative for pallor and rash.  Neurological: Negative for weakness and headaches.  Hematological: Negative for adenopathy.     Past Medical History: Past Medical History:  Diagnosis Date  . No pertinent past medical history      Past Surgical History: Past Surgical History:  Procedure Laterality Date  . AUGMENTATION MAMMAPLASTY     bilateral  . BREAST CYST INCISION AND DRAINAGE Right      Family History: History reviewed. No pertinent family history.  Social History: Social History   Socioeconomic History  . Marital status: Married    Spouse name: Not on file  . Number of  children: 0  . Years of education: Not on file  . Highest education level: Not on file  Occupational History  . Not on file  Tobacco Use  . Smoking status: Never Smoker  . Smokeless tobacco: Never Used  Substance and Sexual Activity  . Alcohol use: Yes    Comment: rarely  . Drug use: Never  . Sexual activity: Not on file  Other Topics Concern  . Not on file  Social History Narrative  . Not on file   Social Determinants of Health   Financial Resource Strain:   . Difficulty of Paying Living Expenses: Not on file  Food Insecurity: No Food Insecurity  . Worried About Charity fundraiser in the Last Year: Never true  . Ran Out of Food in the Last Year: Never true  Transportation Needs: No Transportation Needs  . Lack of Transportation (Medical): No  . Lack of Transportation (Non-Medical): No  Physical Activity:   . Days of Exercise per Week: Not on file  . Minutes of Exercise per Session: Not on file  Stress:   . Feeling of Stress : Not on file  Social Connections:   . Frequency of Communication with Friends and Family: Not on file  . Frequency of Social Gatherings with Friends and Family: Not on file  . Attends Religious Services: Not on file  . Active Member of Clubs or Organizations: Not on file  . Attends Archivist Meetings: Not on file  . Marital Status: Not on file    Allergies: Allergies  Allergen Reactions  .  Tylenol [Acetaminophen] Rash    Outpatient Meds: Current Outpatient Medications  Medication Sig Dispense Refill  . Multiple Vitamin (MULTIVITAMIN) capsule Take 1 capsule by mouth daily.    Marland Kitchen omeprazole (PRILOSEC) 40 MG capsule Take 1 capsule (40 mg total) by mouth daily. 30 capsule 0   No current facility-administered medications for this visit.      ___________________________________________________________________ Objective   Exam:  BP 90/70 (BP Location: Left Arm, Patient Position: Sitting, Cuff Size: Normal)   Pulse 68   Temp  98.5 F (36.9 C)   Ht 5\' 3"  (1.6 m) Comment: height measured without shoes  Wt 122 lb 2 oz (55.4 kg)   LMP 10/08/2019   BMI 21.63 kg/m    General: Well-appearing, normal vocal quality  Eyes: sclera anicteric, no redness  ENT: oral mucosa moist without lesions, no cervical or supraclavicular lymphadenopathy.  No tenderness or fullness felt on the neck or into the jaw.  CV: RRR without murmur, S1/S2, no JVD, no peripheral edema  Resp: clear to auscultation bilaterally, normal RR and effort noted  GI: soft, no tenderness, with active bowel sounds. No guarding or palpable organomegaly noted.  Skin; warm and dry, no rash or jaundice noted  Neuro: awake, alert and oriented x 3. Normal gross motor function and fluent speech   Assessment: Encounter Diagnoses  Name Primary?  . Throat pain Yes  . Heartburn     Globus sensation without associated respiratory symptoms.  Rare episodes of heartburn, seem unlikely related to this throat discomfort. She denies chronic sinus pressure congestion or postnasal drip or allergies. This sounds likely to be benign, possibly also related to environmental factors. Plan:  4-week trial of omeprazole 40 mg once daily. Recommended a humidifier in her bedroom at night Referred her to ENT for consideration of laryngoscopy.  Thank you for the courtesy of this consult.  Please call me with any questions or concerns.  10/10/2019 III  CC: Referring provider noted above

## 2019-10-25 ENCOUNTER — Other Ambulatory Visit: Payer: Self-pay | Admitting: Obstetrics & Gynecology

## 2019-10-25 DIAGNOSIS — Z1231 Encounter for screening mammogram for malignant neoplasm of breast: Secondary | ICD-10-CM

## 2019-11-11 ENCOUNTER — Telehealth: Payer: Self-pay

## 2019-11-11 NOTE — Telephone Encounter (Signed)
She does not need the omeprazole, and I had recommended she stop it.  She can call the ENT office for evaluation when she feels ready to do so.

## 2019-11-11 NOTE — Telephone Encounter (Signed)
Called to check of referral status to ENT/ Dr Jenne Pane. The office has called and she has not called back to schedule. I called the patient in follow up to a refill request for omeprazole. [place] states she stopped taking it as she felt it was of no help and didn't request the refills. I instructed her to call ENT and schedule an appointment. Phone number to Dr Jenne Pane office was provided.

## 2019-11-18 NOTE — Telephone Encounter (Signed)
Heather Contreras was seen on 11-16-2019 and has a follow up on 01-14-2020.
